# Patient Record
Sex: Female | Born: 1955 | Race: White | Hispanic: No | Marital: Single | Smoking: Never smoker
Health system: Southern US, Community
[De-identification: ages and names within clinical notes are randomized; demographics above are authoritative.]

---

## 2007-08-08 ENCOUNTER — Observation Stay (HOSPITAL_COMMUNITY): Admission: EM | Admit: 2007-08-08 | Discharge: 2007-08-09 | Payer: Self-pay | Admitting: Emergency Medicine

## 2007-09-22 ENCOUNTER — Encounter: Admission: RE | Admit: 2007-09-22 | Discharge: 2007-12-01 | Payer: Self-pay | Admitting: Orthopedic Surgery

## 2008-12-16 ENCOUNTER — Other Ambulatory Visit: Admission: RE | Admit: 2008-12-16 | Discharge: 2008-12-16 | Payer: Self-pay | Admitting: Obstetrics and Gynecology

## 2009-09-18 IMAGING — CR DG FOREARM 2V*L*
2 series · 2 of 2 positions shown · non-contrast
Comparison: None

CLINICAL DATA: Laceration left forearm

LEFT FOREARM - 2 VIEW

[w forearm ap left]
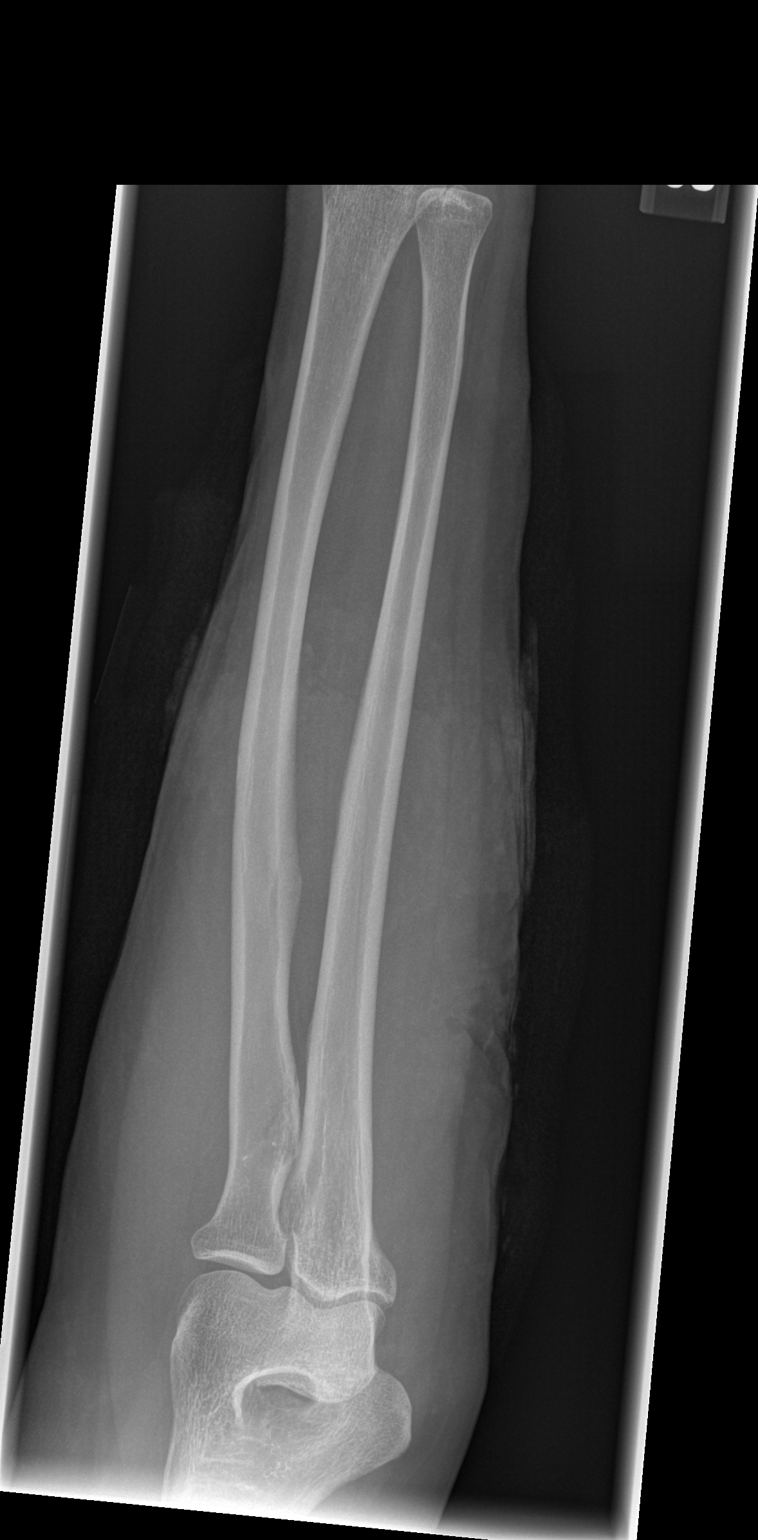

[w forearm lat left]
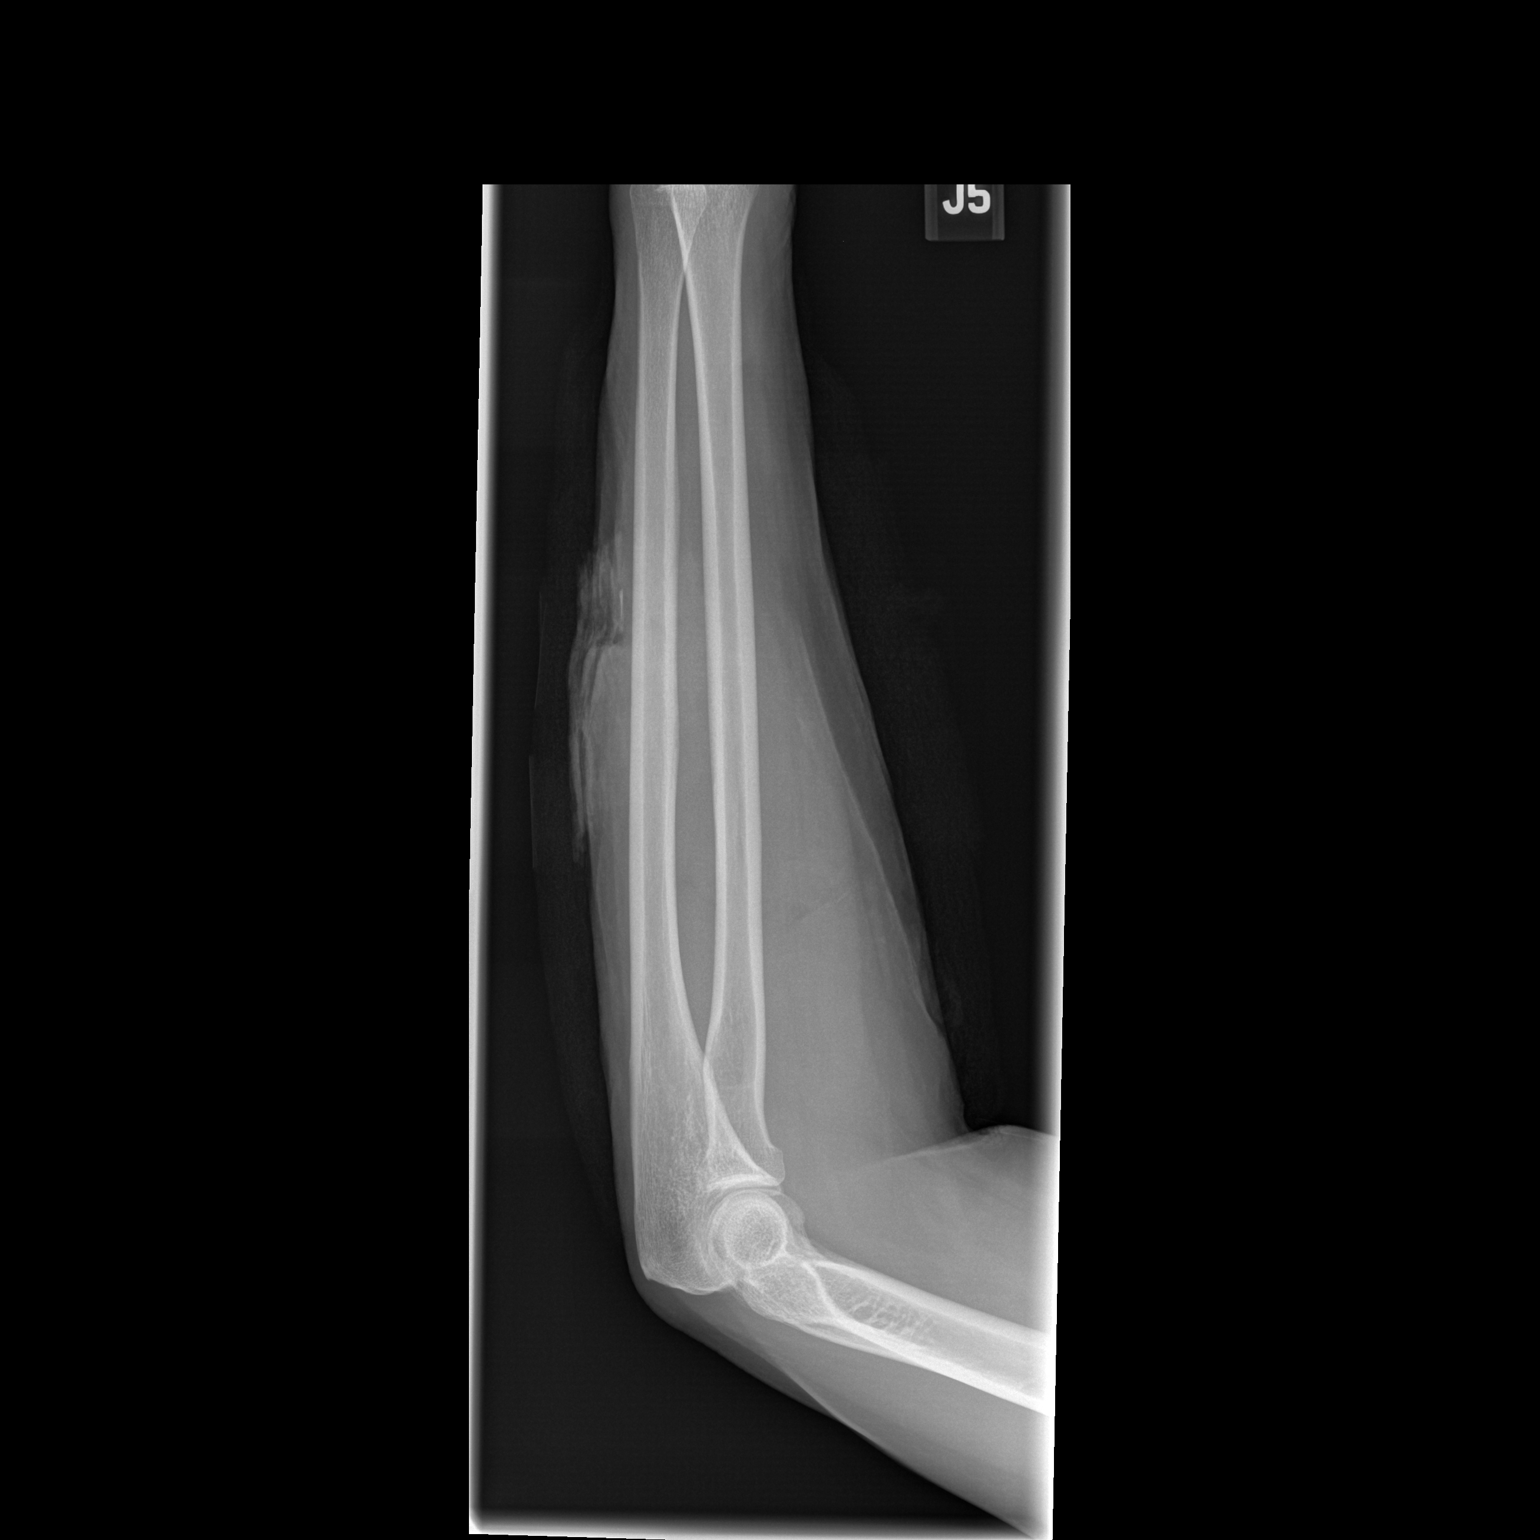

[2 of 2 positions shown; findings below may reference images not displayed]

FINDINGS: Mild osteopenia is noted.  Two views of the left forearm
shows no acute fracture or subluxation.  Bandage artifacts  are
noted.  There is a skin irregularity and probable soft tissue
injury.  Clinical correlation is necessary.
IMPRESSION: No acute fracture or subluxation.  There is  skin irregularity and
probable soft tissue injury.  Clinical correlation is necessary.

## 2010-04-02 ENCOUNTER — Encounter: Payer: Self-pay | Admitting: Obstetrics and Gynecology

## 2010-07-25 NOTE — Op Note (Signed)
NAMEJAMESE, TRAUGER                 ACCOUNT NO.:  [id number]   MEDICAL RECORD NO.:  [id number]          PATIENT TYPE:  INP   LOCATION:  1609                         FACILITY:  [facility]   PHYSICIAN:  Dionne Ano. Gramig III, M.D.DATE OF BIRTH:  [birth year]   DATE OF PROCEDURE:  08/08/2007  DATE OF DISCHARGE:                               OPERATIVE REPORT   PREOPERATIVE DIAGNOSES:  1. Status post plate glass laceration to the left forearm with dorsal      laceration with multiple tendon injuries.  2. Volar laceration x2 involving skin, subcutaneous tissue and muscle.   POSTOPERATIVE DIAGNOSES:  1. Status post plate glass laceration to the left forearm with dorsal      laceration with multiple tendon injuries.  2. Volar laceration x2 involving skin, subcutaneous tissue and muscle.  3. Multiple tendon injuries on the extensor surface including the      extensor digiti minimi, extensor digitorum communis, and extensor      carpi radialis brevis tendon.   SURGICAL PROCEDURES:  1. Irrigation and debridement, dorsal laceration of skin and      subcutaneous tissue muscle via an 8-cm laceration.  2. Irrigation and debridement, volar laceration, skin and subcutaneous      tissue and muscle 8 cm in length.  This was treated with irrigation      and debridement, complex closure, and exploration.  3. Irrigation and debridement, volar laceration 1.5 cm, which was      treated with exploration of the neurovascular structures and      closure.  4. Extensor digitorum communis repair about the ring finger, small      finger, and middle finger, left forearm.  5. Extensor digiti minimi repair, left small finger.  6. Extensor carpi radialis brevis repair, left forearm.  7. Fasciotomy, left forearm.  8. Exploration, volar wound with direct closure of the 8 cm and 1.5 cm      laceration separately.   ASSISTANT:  Karie Chimera, P.A.-C.   COMPLICATIONS:  None.   ANESTHESIA:  General.   TOURNIQUET TIME:   Less than hour.   INDICATIONS FOR PROCEDURE:  This patient is a pleasant female presents  for above-mentioned diagnosis.  I have counseled in regards to risks and  benefits of the surgery including risk of infection, bleeding,  anesthesia, damage to normal structures, and failure of surgery to  accomplish its intended goals of relieving symptoms and restoring  function.  She has some very large lacerations after a fall in the plate  glass while she was painting at her home today.  She understands the  significant predicament, and all questions have been encouraged and  answered.   OPERATIVE PROCEDURE:  The patient was seen by myself and anesthesia, she  was taken to the operative suite and underwent a general anesthetic.  Preoperatively, Ancef was given.  Tetanus was dressed, and the patient  had thorough counseling.  The arm was marked, timeout was called, and  the operation commenced with I&D of the volar lacerations with an 8-cm  laceration.  I&D of the skin, subcutaneous tissue,  and muscle was  accomplished.  This was an excisional debridement.  I explored this  thoroughly.  The ulnar nerve and artery were intact.  She had disruption  of her fascia, but did not have any gross tendon disruption.  This was  closed with complex closure utilizing Prolene at the conclusion of the  case.   Following this, I&D of a volar laceration of 1.5 cm occurred, a thorough  exploration was occurred.  This did not penetrate the fascia but did  require I&D of skin, subcutaneous tissue, and muscle.  There was some  muscle debris from the wound just adjacent to this.  I closed this with  an interrupted Prolene.   Following this I&D, the dorsal laceration occurred.  This was I&D of  skin, subcutaneous tissue, muscle and tendon.  She had multiple tendon  ruptures including the EDC, EDM, and ECRB tendon (extensor carpi  radialis brevis tendon).  I performed an extensive fasciotomy at this  time.   Fasciotomy was performed without difficulty to prevent swelling,  and following this, this was irrigated with greater than 3 liters of  saline as were the other wounds.  Once this was done, I performed repair  with FiberWire suture of the extensor digiti minimi tendon.   Following this, I performed a repair of the extensor digitorum communis,  to the ring, middle, and small finger.  This was at the muscular  tendinous junction.  Hopefully, this will be a decent repair for her.  However, at this high level, results can be variable.   These were repaired with the FiberWire suture, and I gathered these into  a stitch to incorporate all tendons into the main stump.   Following this, I then identified the ECRB tendon.  I performed a tendon  imbrication and repair where laceration occurred.  The patient tolerated  this well.  This was done about the left forearm region of course.   Following this, the wound was closed without difficulty.  It did extend  the incision proximally during the course of the dissection.  Hemostasis  was secured at the end of the procedure.  She had soft compartments.  Good hemostasis.  No complicating features.  With all the wounds closed,  she was then dressed sterilely and placed in a long-arm splint with  fingers and hand wrist in full extension.  She tolerated this well.   We will plan for 4 weeks of complete immobilization followed by  removable splint placement in 4 weeks and interval range of motion.  I  have discussed with her the relevant do's and do not's.  She will be  admitted for postop IV antibiotics, pain management general protocol.  As this was a quite severe laceration, we will do our best to try to  give the best arm possible.   It has been a pleasure to participate in her care.  We look forward to  participate in her postoperative care.      Dionne Ano. Everlene Other, M.D.     Nash Mantis  D:  08/08/2007  T:  08/09/2007  Job:  [id number]

## 2010-12-06 LAB — COMPREHENSIVE METABOLIC PANEL
ALT: 28
AST: 22
Albumin: 4.1
Alkaline Phosphatase: 50
Chloride: 111
Creatinine, Ser: 0.62
Glucose, Bld: 107 — ABNORMAL HIGH
Total Bilirubin: 0.7

## 2010-12-06 LAB — DIFFERENTIAL
Basophils Absolute: 0
Basophils Relative: 0
Eosinophils Relative: 0
Lymphs Abs: 1.2
Monocytes Absolute: 0.8
Neutro Abs: 11.2 — ABNORMAL HIGH

## 2010-12-06 LAB — CBC
Hemoglobin: 14.2
MCHC: 34.2
RDW: 13.8

## 2016-06-12 DIAGNOSIS — J00 Acute nasopharyngitis [common cold]: Secondary | ICD-10-CM | POA: Diagnosis not present

## 2016-07-24 DIAGNOSIS — J3489 Other specified disorders of nose and nasal sinuses: Secondary | ICD-10-CM | POA: Diagnosis not present

## 2016-07-24 DIAGNOSIS — Z23 Encounter for immunization: Secondary | ICD-10-CM | POA: Diagnosis not present

## 2017-12-09 DIAGNOSIS — Z23 Encounter for immunization: Secondary | ICD-10-CM | POA: Diagnosis not present

## 2021-01-05 DIAGNOSIS — S52531A Colles' fracture of right radius, initial encounter for closed fracture: Secondary | ICD-10-CM | POA: Insufficient documentation

## 2021-01-07 DIAGNOSIS — W109XXA Fall (on) (from) unspecified stairs and steps, initial encounter: Secondary | ICD-10-CM | POA: Insufficient documentation

## 2022-06-13 DIAGNOSIS — F331 Major depressive disorder, recurrent, moderate: Secondary | ICD-10-CM | POA: Diagnosis not present

## 2022-08-10 ENCOUNTER — Encounter: Payer: Self-pay | Admitting: Nurse Practitioner

## 2022-08-10 ENCOUNTER — Ambulatory Visit (INDEPENDENT_AMBULATORY_CARE_PROVIDER_SITE_OTHER): Payer: Medicare HMO | Admitting: Nurse Practitioner

## 2022-08-10 VITALS — BP 168/98 | HR 52 | Temp 98.7°F | Ht 69.0 in | Wt 169.0 lb

## 2022-08-10 DIAGNOSIS — Z1211 Encounter for screening for malignant neoplasm of colon: Secondary | ICD-10-CM | POA: Insufficient documentation

## 2022-08-10 DIAGNOSIS — K056 Periodontal disease, unspecified: Secondary | ICD-10-CM

## 2022-08-10 DIAGNOSIS — Z1231 Encounter for screening mammogram for malignant neoplasm of breast: Secondary | ICD-10-CM

## 2022-08-10 DIAGNOSIS — E785 Hyperlipidemia, unspecified: Secondary | ICD-10-CM

## 2022-08-10 DIAGNOSIS — Z8669 Personal history of other diseases of the nervous system and sense organs: Secondary | ICD-10-CM | POA: Diagnosis not present

## 2022-08-10 DIAGNOSIS — F329 Major depressive disorder, single episode, unspecified: Secondary | ICD-10-CM | POA: Diagnosis not present

## 2022-08-10 DIAGNOSIS — Z13820 Encounter for screening for osteoporosis: Secondary | ICD-10-CM | POA: Insufficient documentation

## 2022-08-10 DIAGNOSIS — Z124 Encounter for screening for malignant neoplasm of cervix: Secondary | ICD-10-CM

## 2022-08-10 NOTE — Progress Notes (Signed)
New Patient Office Visit  Subjective    Patient ID: Samantha Cook, female    DOB: 01/10/1956  Age: 67 y.o. MRN: [id number]  CC:  Chief Complaint  Patient presents with   Establish Care    Here to establish. And referral periodontist: Visited 06/29/22 [facility]  [phone] w, [facility]  and [facility]  [address]  FAX: [phone]  Visited on 06/13/2022 Needs dates back dated     HPI Samantha Cook presents to establish care Has not seen primary care provider on a consistent basis for years. She now has new insurance and is now looking to establish care on a consistent basis.  Reports she has history of periodontal disease, would like referral to periodontist. She also has longstanding history of major depression, her mother passed away few years ago and she would like to start counseling services.  Requesting referral to this as well. She is not sure when her last Pap smear was.  She is not sure when her last tetanus shot was.  She has never had colonoscopy for colon cancer screening.  She is due for breast cancer screening via mammogram.  Has not had pneumonia vaccine administered or shingles vaccine administered.  She would also be due for DEXA scan to screen for osteoporosis.  She prefers not to undergo hep C or HIV screening.  She also reports that she was diagnosed with hyperlipidemia in the past, but when treated with statins experience myalgias so she discontinued these.  She reports having been told she has glaucoma, years ago.  Has not followed up with an eye doctor since then.  No outpatient encounter medications on file as of 08/10/2022.   No facility-administered encounter medications on file as of 08/10/2022.    No past medical history on file.    Family History  Problem Relation Age of Onset   Heart attack Father    Hypertension Brother     Social History   Socioeconomic History   Marital  status: Single    Spouse name: Not on file   Number of children: Not on file   Years of education: Not on file   Highest education level: Not on file  Occupational History   Not on file  Tobacco Use   Smoking status: Never   Smokeless tobacco: Never  Substance and Sexual Activity   Alcohol use: Not on file    Comment: rarely   Drug use: Never   Sexual activity: Not Currently  Other Topics Concern   Not on file  Social History Narrative   Not on file   Social Determinants of Health   Financial Resource Strain: Not on file  Food Insecurity: Not on file  Transportation Needs: Not on file  Physical Activity: Not on file  Stress: Not on file  Social Connections: Not on file  Intimate Partner Violence: Not on file    Review of Systems  Respiratory:  Negative for cough, shortness of breath and wheezing.   Cardiovascular:  Negative for chest pain and palpitations.  Gastrointestinal:  Positive for heartburn. Negative for blood in stool.  Genitourinary:  Negative for hematuria.       (-) vaginal bleed  Psychiatric/Behavioral:  Positive for depression (lonely). Negative for suicidal ideas.         Objective    BP (!) 168/98 (BP Location: Left Arm, Patient Position: Sitting, Cuff Size: Normal)   Pulse Marland Kitchen)  52   Temp 98.7 F (37.1 C) (Oral)   Ht 5\' 9"  (1.753 m)   Wt 169 lb (76.7 kg)   SpO2 95%   BMI 24.96 kg/m      08/10/2022    1:12 PM  PHQ9 SCORE ONLY  PHQ-9 Total Score 0     Physical Exam Vitals reviewed.  Constitutional:      General: She is not in acute distress.    Appearance: Normal appearance.  HENT:     Head: Normocephalic and atraumatic.  Neck:     Vascular: No carotid bruit.  Cardiovascular:     Rate and Rhythm: Normal rate and regular rhythm.     Pulses: Normal pulses.     Heart sounds: Normal heart sounds.  Pulmonary:     Effort: Pulmonary effort is normal.     Breath sounds: Normal breath sounds.  Skin:    General: Skin is warm and dry.   Neurological:     General: No focal deficit present.     Mental Status: She is alert and oriented to person, place, and time.  Psychiatric:        Mood and Affect: Mood normal.        Behavior: Behavior normal.        Judgment: Judgment normal.         Assessment & Plan:   Problem List Items Addressed This Visit       Digestive   Periodontal disease - Primary    Referral to periodontist ordered today      Relevant Orders   Ambulatory referral to Dentistry     Other   Major depressive disorder    Referral to counseling service made today Patient denies suicidal ideation Patient will prefer not to consider pharmacological treatment at this time      Relevant Orders   Ambulatory referral to Psychology   CBC   Comprehensive metabolic panel   Lipid panel   TSH   Colon cancer screening    Referral to GI for colon cancer screening made today.      Relevant Orders   Ambulatory referral to Gastroenterology   Cervical cancer screening    Referral to OB/GYN for cervical cancer screening at today.      Relevant Orders   Ambulatory referral to Obstetrics / Gynecology   Hx of glaucoma    Referral to ophthalmology made today.      Relevant Orders   Ambulatory referral to Ophthalmology   Encounter for screening mammogram for malignant neoplasm of breast    Screening mammogram ordered today.      Relevant Orders   MM 3D SCREENING MAMMOGRAM BILATERAL BREAST   Osteoporosis screening    DEXA scan ordered for osteoporosis screening today      Relevant Orders   DG Bone Density   Hyperlipidemia    Labs ordered, further recommendations may be made based upon his results       Relevant Orders   Comprehensive metabolic panel   Lipid panel    Return in about 1 month (around 09/09/2022) for F/U with [name].   [name], NP

## 2022-08-10 NOTE — Assessment & Plan Note (Signed)
Referral to GI for colon cancer screening made today.

## 2022-08-10 NOTE — Assessment & Plan Note (Signed)
Screening mammogram ordered today ?

## 2022-08-10 NOTE — Assessment & Plan Note (Signed)
Referral to periodontist ordered today

## 2022-08-10 NOTE — Patient Instructions (Addendum)
Get tetanus shot at pharmacy if last vaccine was administered >10 years ago Ask about pneumonia vaccine at pharmacy Ask about shingles vaccine at pharmacy

## 2022-08-10 NOTE — Assessment & Plan Note (Signed)
Referral to counseling service made today Patient denies suicidal ideation Patient will prefer not to consider pharmacological treatment at this time

## 2022-08-10 NOTE — Assessment & Plan Note (Signed)
Labs ordered, further recommendations may be made based upon his results. 

## 2022-08-10 NOTE — Assessment & Plan Note (Signed)
Referral to OB/GYN for cervical cancer screening at today.

## 2022-08-10 NOTE — Assessment & Plan Note (Signed)
DEXA scan ordered for osteoporosis screening today 

## 2022-08-10 NOTE — Assessment & Plan Note (Signed)
Referral to ophthalmology made today.  

## 2022-08-15 LAB — COMPREHENSIVE METABOLIC PANEL
ALT: 14 U/L (ref 0–35)
AST: 13 U/L (ref 0–37)
Albumin: 3.6 g/dL (ref 3.5–5.2)
Alkaline Phosphatase: 47 U/L (ref 39–117)
BUN: 17 mg/dL (ref 6–23)
CO2: 28 mEq/L (ref 19–32)
Calcium: 8.5 mg/dL (ref 8.4–10.5)
Chloride: 105 mEq/L (ref 96–112)
Creatinine, Ser: 0.64 mg/dL (ref 0.40–1.20)
GFR: 92.76 mL/min (ref >60.00)
Glucose, Bld: 84 mg/dL (ref 70–99)
Potassium: 4.2 mEq/L (ref 3.5–5.1)
Sodium: 141 mEq/L (ref 135–145)
Total Bilirubin: 0.3 mg/dL (ref 0.2–1.2)
Total Protein: 5.9 g/dL — ABNORMAL LOW (ref 6.0–8.3)

## 2022-08-15 LAB — CBC
HCT: 38.6 % (ref 36.0–46.0)
Hemoglobin: 12.5 g/dL (ref 12.0–15.0)
MCHC: 32.4 g/dL (ref 30.0–36.0)
MCV: 92.9 fl (ref 78.0–100.0)
Platelets: 251 10*3/uL (ref 150.0–400.0)
RBC: 4.16 Mil/uL (ref 3.87–5.11)
RDW: 14.1 % (ref 11.5–15.5)
WBC: 4.4 10*3/uL (ref 4.0–10.5)

## 2022-08-15 LAB — TSH: TSH: 0.96 u[IU]/mL (ref 0.35–5.50)

## 2022-08-15 LAB — LIPID PANEL
Cholesterol: 192 mg/dL (ref 0–200)
HDL: 63.4 mg/dL (ref >39.00)
LDL Cholesterol: 110 mg/dL — ABNORMAL HIGH (ref 0–99)
NonHDL: 128.62
Total CHOL/HDL Ratio: 3
Triglycerides: 95 mg/dL (ref 0.0–149.0)
VLDL: 19 mg/dL (ref 0.0–40.0)

## 2022-08-23 ENCOUNTER — Ambulatory Visit: Payer: Medicare HMO | Admitting: Nurse Practitioner

## 2022-08-29 ENCOUNTER — Ambulatory Visit (INDEPENDENT_AMBULATORY_CARE_PROVIDER_SITE_OTHER): Payer: Medicare HMO | Admitting: Internal Medicine

## 2022-08-29 ENCOUNTER — Encounter: Payer: Self-pay | Admitting: Internal Medicine

## 2022-08-29 VITALS — BP 150/90 | HR 55 | Temp 98.2°F | Ht 69.0 in | Wt 168.0 lb

## 2022-08-29 DIAGNOSIS — I10 Essential (primary) hypertension: Secondary | ICD-10-CM | POA: Diagnosis not present

## 2022-08-29 HISTORY — DX: Essential (primary) hypertension: I10

## 2022-08-29 MED ORDER — VALSARTAN 160 MG PO TABS
160.0000 mg | ORAL_TABLET | Freq: Every day | ORAL | 5 refills | Status: DC
Start: 2022-08-29 — End: 2022-09-14

## 2022-08-29 NOTE — Progress Notes (Signed)
Subjective:  Patient ID: Samantha Cook, female    DOB: 06/05/1955  Age: 67 y.o. MRN: [id number]  CC: Hypertension   HPI Samantha Cook presents for elevated BP SBP>150, headaches. It was 180 a couple times  No outpatient medications prior to visit.   No facility-administered medications prior to visit.    ROS: Review of Systems  Constitutional:  Negative for activity change, appetite change, chills, fatigue and unexpected weight change.  HENT:  Negative for congestion, mouth sores and sinus pressure.   Eyes:  Negative for visual disturbance.  Respiratory:  Negative for cough and chest tightness.   Gastrointestinal:  Negative for abdominal pain and nausea.  Genitourinary:  Negative for difficulty urinating, frequency and vaginal pain.  Musculoskeletal:  Negative for back pain and gait problem.  Skin:  Negative for pallor and rash.  Neurological:  Negative for dizziness, tremors, weakness, numbness and headaches.  Psychiatric/Behavioral:  Negative for confusion and sleep disturbance.     Objective:  BP (!) 150/90 (BP Location: Right Arm, Patient Position: Sitting, Cuff Size: Normal)   Pulse (!) 55   Temp 98.2 F (36.8 C) (Oral)   Ht 5\' 9"  (1.753 m)   Wt 168 lb (76.2 kg)   SpO2 98%   BMI 24.81 kg/m   BP Readings from Last 3 Encounters:  08/29/22 (!) 150/90  08/10/22 (!) 168/98    Wt Readings from Last 3 Encounters:  08/29/22 168 lb (76.2 kg)  08/10/22 169 lb (76.7 kg)    Physical Exam Constitutional:      General: She is not in acute distress.    Appearance: She is well-developed.  HENT:     Head: Normocephalic.     Right Ear: External ear normal.     Left Ear: External ear normal.     Nose: Nose normal.  Eyes:     General:        Right eye: No discharge.        Left eye: No discharge.     Conjunctiva/sclera: Conjunctivae normal.     Pupils: Pupils are equal, round, and reactive to light.  Neck:     Thyroid: No thyromegaly.     Vascular: No JVD.      Trachea: No tracheal deviation.  Cardiovascular:     Rate and Rhythm: Normal rate and regular rhythm.     Heart sounds: Normal heart sounds.  Pulmonary:     Effort: No respiratory distress.     Breath sounds: No stridor. No wheezing.  Abdominal:     General: Bowel sounds are normal. There is no distension.     Palpations: Abdomen is soft. There is no mass.     Tenderness: There is no abdominal tenderness. There is no guarding or rebound.  Musculoskeletal:        General: No tenderness.     Cervical back: Normal range of motion and neck supple. No rigidity.  Lymphadenopathy:     Cervical: No cervical adenopathy.  Skin:    Findings: No erythema or rash.  Neurological:     Cranial Nerves: No cranial nerve deficit.     Motor: No abnormal muscle tone.     Coordination: Coordination normal.     Deep Tendon Reflexes: Reflexes normal.  Psychiatric:        Behavior: Behavior normal.        Thought Content: Thought content normal.        Judgment: Judgment normal.     Lab Results  Component Value Date   WBC 4.4 08/15/2022   HGB 12.5 08/15/2022   HCT 38.6 08/15/2022   PLT 251.0 08/15/2022   GLUCOSE 84 08/15/2022   CHOL 192 08/15/2022   TRIG 95.0 08/15/2022   HDL 63.40 08/15/2022   LDLCALC 110 (H) 08/15/2022   ALT 14 08/15/2022   AST 13 08/15/2022   NA 141 08/15/2022   K 4.2 08/15/2022   CL 105 08/15/2022   CREATININE 0.64 08/15/2022   BUN 17 08/15/2022   CO2 28 08/15/2022   TSH 0.96 08/15/2022    No results found.  Assessment & Plan:   Problem List Items Addressed This Visit     HTN (hypertension) - Primary    New. Start Valsartan 160 mg/d NAS diet RTC 4 wks      Relevant Medications   valsartan (DIOVAN) 160 MG tablet      Meds ordered this encounter  Medications   valsartan (DIOVAN) 160 MG tablet    Sig: Take 1 tablet (160 mg total) by mouth daily.    Dispense:  3 tablet    Refill:  5      Follow-up: Return in about 4 weeks (around 09/26/2022) for a  follow-up visit.  Sonda Primes, MD

## 2022-08-29 NOTE — Assessment & Plan Note (Signed)
New. Start Valsartan 160 mg/d NAS diet RTC 4 wks

## 2022-09-12 ENCOUNTER — Telehealth: Payer: Self-pay | Admitting: Nurse Practitioner

## 2022-09-12 NOTE — Telephone Encounter (Signed)
Patient saw Dr. Posey Rea on 08/29/2022 and he prescribed her valsartan (DIOVAN) 160 MG tablet . She is scheduled for 09/20/2022 to follow up with her PCP, but she does not have enough medication to last until the appointment. She would like to know if more can be sent in to [facility] on [address] in [place]. Best callback is [phone].

## 2022-09-14 ENCOUNTER — Other Ambulatory Visit: Payer: Self-pay

## 2022-09-14 MED ORDER — VALSARTAN 160 MG PO TABS: 160.0000 mg | ORAL_TABLET | Freq: Every day | ORAL | 5 refills | Status: DC

## 2022-09-14 NOTE — Telephone Encounter (Signed)
Medication request send

## 2022-09-20 ENCOUNTER — Ambulatory Visit: Payer: Medicare HMO | Admitting: Nurse Practitioner

## 2022-10-10 ENCOUNTER — Encounter (INDEPENDENT_AMBULATORY_CARE_PROVIDER_SITE_OTHER): Payer: Self-pay

## 2022-10-15 ENCOUNTER — Telehealth: Payer: Self-pay | Admitting: Nurse Practitioner

## 2022-10-15 NOTE — Telephone Encounter (Signed)
Pt  service agent called about referral and insurance change can someone reach out to pt to help better assist pt not really understanding what pt was asking.

## 2022-10-17 NOTE — Telephone Encounter (Signed)
Left voice mail for pt to call back in regards to their issue

## 2022-10-19 ENCOUNTER — Other Ambulatory Visit: Payer: Self-pay | Admitting: Nurse Practitioner

## 2022-10-19 DIAGNOSIS — F329 Major depressive disorder, single episode, unspecified: Secondary | ICD-10-CM

## 2022-10-19 DIAGNOSIS — Z1211 Encounter for screening for malignant neoplasm of colon: Secondary | ICD-10-CM

## 2022-10-19 DIAGNOSIS — K056 Periodontal disease, unspecified: Secondary | ICD-10-CM

## 2022-10-19 DIAGNOSIS — Z8669 Personal history of other diseases of the nervous system and sense organs: Secondary | ICD-10-CM

## 2022-10-19 DIAGNOSIS — Z124 Encounter for screening for malignant neoplasm of cervix: Secondary | ICD-10-CM

## 2022-10-19 NOTE — Telephone Encounter (Signed)
Returned pt called, pt stated that based on her insurance card Dr. Yetta Barre was supposed to her PCP, but someone told her, she could see Lorelee New because Maralyn Sago was under Dr. Yetta Barre she could see her and her insurance will pay for the visit. After her visit with Maralyn Sago, she got a notice from her insurance that they will not pay for it since she did not see Dr. Yetta Barre. Pt want her visit and referral that was put in place during her visit to be resend to her insurance again .

## 2022-10-19 NOTE — Telephone Encounter (Signed)
Patient returned Grace's call and said [facility] would need to resubmit referrals due to insurance issues. The referrals were not covered by insurance at first because insurance had wrong provider listed as PCP. She has gotten that fixed to have [facility] listed as her provider, so they just need to redo those for them to be covered. Best callback is [phone].

## 2022-10-23 NOTE — Telephone Encounter (Signed)
Please call patient

## 2022-11-08 ENCOUNTER — Telehealth: Payer: Self-pay | Admitting: Nurse Practitioner

## 2022-11-08 ENCOUNTER — Encounter: Payer: Self-pay | Admitting: Nurse Practitioner

## 2022-11-08 ENCOUNTER — Ambulatory Visit (INDEPENDENT_AMBULATORY_CARE_PROVIDER_SITE_OTHER): Payer: Medicare HMO | Admitting: Nurse Practitioner

## 2022-11-08 VITALS — BP 136/74 | HR 63 | Temp 97.6°F | Ht 69.0 in | Wt 166.0 lb

## 2022-11-08 DIAGNOSIS — F321 Major depressive disorder, single episode, moderate: Secondary | ICD-10-CM

## 2022-11-08 DIAGNOSIS — I10 Essential (primary) hypertension: Secondary | ICD-10-CM

## 2022-11-08 DIAGNOSIS — E785 Hyperlipidemia, unspecified: Secondary | ICD-10-CM | POA: Diagnosis not present

## 2022-11-08 NOTE — Progress Notes (Signed)
Established Patient Office Visit  Subjective   Patient ID: Samantha Cook, female    DOB: 10-08-1955  Age: 67 y.o. MRN: [id number]  Chief Complaint  Patient presents with   Hyperlipidemia    HLD/HTN/Depression: Last LDL 110.  Patient has tried and failed statins in the past.  Not currently on any medication for cholesterol.  Was prescribed valsartan for hypertension which she tolerated for 2 weeks but then had a few episodes of hypotension so she discontinued it.  Did seek counseling previously for depression after her mother died.  Most likely situational and she reports that her mood is much improved and she is not worried about depressed mood currently.  Is requesting referral back to counseling services to help with insurance coverage.    Review of Systems  Respiratory:  Negative for shortness of breath.   Cardiovascular:  Negative for chest pain.  Psychiatric/Behavioral:  Negative for depression and suicidal ideas. The patient is not nervous/anxious.       Objective:     BP 136/74   Pulse 63   Temp 97.6 F (36.4 C) (Temporal)   Ht 5\' 9"  (1.753 m)   Wt 166 lb (75.3 kg)   SpO2 94%   BMI 24.51 kg/m  BP Readings from Last 3 Encounters:  11/08/22 136/74  08/29/22 (!) 150/90  08/10/22 (!) 168/98   Wt Readings from Last 3 Encounters:  11/08/22 166 lb (75.3 kg)  08/29/22 168 lb (76.2 kg)  08/10/22 169 lb (76.7 kg)         11/08/2022    9:02 AM 08/29/2022   10:18 AM 08/10/2022    1:12 PM  PHQ9 SCORE ONLY  PHQ-9 Total Score 0 0 0     Physical Exam Vitals reviewed.  Constitutional:      General: She is not in acute distress.    Appearance: Normal appearance.  HENT:     Head: Normocephalic and atraumatic.  Neck:     Vascular: No carotid bruit.  Cardiovascular:     Rate and Rhythm: Normal rate and regular rhythm.     Pulses: Normal pulses.     Heart sounds: Normal heart sounds.  Pulmonary:     Effort: Pulmonary effort is normal.     Breath sounds: Normal  breath sounds.  Skin:    General: Skin is warm and dry.  Neurological:     General: No focal deficit present.     Mental Status: She is alert and oriented to person, place, and time.  Psychiatric:        Mood and Affect: Mood normal.        Behavior: Behavior normal.        Judgment: Judgment normal.      No results found for any visits on 11/08/22.    The 10-year ASCVD risk score (Arnett DK, et al., 2019) is: 5.8%    Assessment & Plan:   Problem List Items Addressed This Visit       Cardiovascular and Mediastinum   HTN (hypertension)    Chronic, much improved, stable Continue DASH diet, continue off of valsartan Follow-up in 6 months        Other   Major depressive disorder - Primary    Chronic, stable, in remission Referral to counseling services made today per patient request.  She was encouraged to follow-up with them as she feels that her mood starts to worsen again.      Relevant Orders   Ambulatory referral to  Psychology   Hyperlipidemia    Chronic Encouraged her to continue DASH diet as well as consider adding components of Mediterranean diet to help with maintaining normal cholesterol. We had a shared decision-making conversation regarding medication, for now patient would prefer to focus on lifestyle modification.       Return in about 6 months (around 05/10/2023) for F/U with Maralyn Sago.    [name], NP

## 2022-11-08 NOTE — Assessment & Plan Note (Signed)
Chronic Encouraged her to continue DASH diet as well as consider adding components of Mediterranean diet to help with maintaining normal cholesterol. We had a shared decision-making conversation regarding medication, for now patient would prefer to focus on lifestyle modification.

## 2022-11-08 NOTE — Telephone Encounter (Signed)
I need assistance with a referral for this patient. She was referred to counseling services ([facility]) back in 08/10/2022. She was seen today and tells me that [facility] needs [place] to reorder the referral as they have no record the referral was made. I have called [employer] as requested by the patient and they asked for the CPT code that is attached to the referral. I am unfamiliar with what CPT is needed for this. They suggested I call [employer]. I called [employer], they require an [employer] username in order to verify my identity. I do not have a user name for this company so they were unable to help. I am unsure what to do moving forward to help the patient. Is there anyone that we can forward this message to that may be able to assist the patient in getting our referral submitted to [facility]? I did reorder the referral today (11/08/22) as well to see if this may be helpful in the process.  Thank you.

## 2022-11-08 NOTE — Assessment & Plan Note (Signed)
Chronic, stable, in remission Referral to counseling services made today per patient request.  She was encouraged to follow-up with them as she feels that her mood starts to worsen again.

## 2022-11-08 NOTE — Assessment & Plan Note (Addendum)
Chronic, much improved, stable Continue DASH diet, continue off of valsartan Follow-up in 6 months

## 2022-11-23 ENCOUNTER — Telehealth: Payer: Self-pay | Admitting: Nurse Practitioner

## 2022-11-23 NOTE — Telephone Encounter (Signed)
Due to the pt having medicare [facility] they do not accept medicare referral must be sent to a  another location.

## 2022-11-26 NOTE — Telephone Encounter (Signed)
Called pt and let them be aware to reach out to their insurance about what OB/GYN they are in partner with, then to call us so that we can put in another referral

## 2023-01-23 DIAGNOSIS — H2513 Age-related nuclear cataract, bilateral: Secondary | ICD-10-CM | POA: Diagnosis not present

## 2023-01-23 DIAGNOSIS — H40013 Open angle with borderline findings, low risk, bilateral: Secondary | ICD-10-CM | POA: Diagnosis not present

## 2023-02-28 ENCOUNTER — Telehealth: Payer: Self-pay | Admitting: Internal Medicine

## 2023-02-28 NOTE — Telephone Encounter (Signed)
Not sure why I got this message. She is not a patient at my office.

## 2023-05-10 ENCOUNTER — Ambulatory Visit: Payer: Medicare HMO | Admitting: Nurse Practitioner

## 2023-05-30 ENCOUNTER — Ambulatory Visit: Payer: 59

## 2023-07-02 ENCOUNTER — Ambulatory Visit

## 2023-07-11 ENCOUNTER — Ambulatory Visit

## 2023-08-02 ENCOUNTER — Ambulatory Visit

## 2023-09-09 ENCOUNTER — Ambulatory Visit

## 2023-10-29 ENCOUNTER — Ambulatory Visit: Payer: Self-pay

## 2023-10-29 NOTE — Telephone Encounter (Signed)
 FYI Only or Action Required?: FYI only for provider.  Patient was last seen in primary care on 11/08/2022 by Elnor Lauraine BRAVO, NP.  Called Nurse Triage reporting Chest Pain.  Symptoms began 10 minutes ago.  Interventions attempted: Nothing.  Symptoms are: gradually worsening.  Triage Disposition: Call EMS 911 Now  Patient/caregiver understands and will follow disposition?: Unsure  Copied from CRM #[id number]. Topic: Clinical - Red Word Triage >> Oct 29, 2023  4:22 PM Lauren C wrote: Red Word that prompted transfer to Nurse Triage: chest pain for 5 mins Reason for Disposition  [1] Chest pain lasts > 5 minutes AND [2] age > 4  Answer Assessment - Initial Assessment Questions 1. LOCATION: Where does it hurt?       Size of hand above breast in middle and jaw pain 3. ONSET: When did the chest pain begin? (Minutes, hours or days)      7 min  Pt states that she had an episode more in the side of her chest earlier. Pt states it subsided and now has the substernal CP. Pt states that she also has jaw pain. Pt denies SOB. 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 RN strongly recommended pt go to ED, pt unsure if she will go at time of call.  Protocols used: Chest Pain-A-AH

## 2023-10-29 NOTE — Telephone Encounter (Addendum)
 Patient was triaged by another RN at approximately 1620 today 10/29/23 and refused ED recommendation.  Patient reports that she began experiencing chest pain that radates to her jaw at approximately 1600 today 10/29/23. Reports improvement but not full resolution of her symptoms. States she would like to make an in office appointment.   This RN provided rationale for 911/ED recommendation including both chest pain and jaw pain potentially having a cardiac cause.   Patient refused 911/ED recommendation x 3+ times. This RN contacted PCP office via CAL line to relay refusal. To staff member Lauraine.   This RN returned to patient call and she refused 911/ED once more, citing insurance concerns. Patient agrees to go if symptoms worsen.   Copied from CRM #[id number]. Topic: Clinical - Red Word Triage >> Oct 29, 2023  4:22 PM Tinnie BROCKS wrote: Red Word that prompted transfer to Nurse Triage: chest pain for 5 mins >> Oct 29, 2023  4:38 PM DeAngela L wrote:  patient states she is experiencing chest pain and her jaw is hurting on one side this has beena pain for almost 20 minutes now  Patient thinks its feeling less effects now and patient wants to schedule an OV and not got to ER   Pt num [phone] (M)

## 2023-10-30 NOTE — Telephone Encounter (Signed)
 Called and left voice mail to give our office a call to schedule an appointment in regards to her chest pain and also provided them with our office number

## 2023-11-12 ENCOUNTER — Ambulatory Visit

## 2023-12-13 ENCOUNTER — Ambulatory Visit (INDEPENDENT_AMBULATORY_CARE_PROVIDER_SITE_OTHER)

## 2023-12-13 VITALS — BP 118/80 | HR 68 | Ht 69.0 in | Wt 171.4 lb

## 2023-12-13 DIAGNOSIS — Z0000 Encounter for general adult medical examination without abnormal findings: Secondary | ICD-10-CM

## 2023-12-13 NOTE — Progress Notes (Signed)
 Subjective:   Samantha Cook is a 68 y.o. who presents for a Medicare Wellness preventive visit.  As a reminder, Annual Wellness Visits don't include a physical exam, and some assessments may be limited, especially if this visit is performed virtually. We may recommend an in-person follow-up visit with your provider if needed.  Visit Complete: In person  Persons Participating in Visit: Patient.  AWV Questionnaire: No: Patient Medicare AWV questionnaire was not completed prior to this visit.  Cardiac Risk Factors include: advanced age (>2men, >64 women);hypertension;dyslipidemia     Objective:    Today's Vitals   12/13/23 1544  BP: 118/80  Pulse: 68  SpO2: 98%  Weight: 171 lb 6.4 oz (77.7 kg)  Height: 5' 9 (1.753 m)   Body mass index is 25.31 kg/m.     12/13/2023    3:48 PM  Advanced Directives  Does Patient Have a Medical Advance Directive? Yes  Type of Estate agent of [place];Living will  Copy of Healthcare Power of Attorney in Chart? No - copy requested    Current Medications (verified) No outpatient encounter medications on file as of 12/13/2023.   No facility-administered encounter medications on file as of 12/13/2023.    Allergies (verified) Statins   History: Past Medical History:  Diagnosis Date   HTN (hypertension) 08/29/2022   History reviewed. No pertinent surgical history. Family History  Problem Relation Age of Onset   Heart attack Father    Hypertension Brother    Social History   Socioeconomic History   Marital status: Single    Spouse name: Not on file   Number of children: Not on file   Years of education: Not on file   Highest education level: Not on file  Occupational History   Occupation: RETIRED  Tobacco Use   Smoking status: Never   Smokeless tobacco: Never  Vaping Use   Vaping status: Never Used  Substance and Sexual Activity   Alcohol use: Not on file    Comment: rarely   Drug use: Never   Sexual  activity: Not Currently  Other Topics Concern   Not on file  Social History Narrative   Lives alone/2025   Social Drivers of Health   Financial Resource Strain: Medium Risk (12/13/2023)   Overall Financial Resource Strain (CARDIA)    Difficulty of Paying Living Expenses: Somewhat hard  Food Insecurity: No Food Insecurity (12/13/2023)   Hunger Vital Sign    Worried About Running Out of Food in the Last Year: Never true    Ran Out of Food in the Last Year: Never true  Transportation Needs: No Transportation Needs (12/13/2023)   PRAPARE - Administrator, Civil Service (Medical): No    Lack of Transportation (Non-Medical): No  Physical Activity: Inactive (12/13/2023)   Exercise Vital Sign    Days of Exercise per Week: 0 days    Minutes of Exercise per Session: 0 min  Stress: No Stress Concern Present (12/13/2023)   [employer] of Occupational Health - Occupational Stress Questionnaire    Feeling of Stress: Not at all  Social Connections: Socially Isolated (12/13/2023)   Social Connection and Isolation Panel    Frequency of Communication with Friends and Family: More than three times a week    Frequency of Social Gatherings with Friends and Family: More than three times a week    Attends Religious Services: Never    Database administrator or Organizations: No    Attends Club or  Organization Meetings: Never    Marital Status: Never married    Tobacco Counseling Counseling given: Not Answered    Clinical Intake:  Pre-visit preparation completed: Yes  Pain : No/denies pain     BMI - recorded: 25.31 Nutritional Status: BMI 25 -29 Overweight Nutritional Risks: None Diabetes: No  No results found for: HGBA1C   How often do you need to have someone help you when you read instructions, pamphlets, or other written materials from your doctor or pharmacy?: 1 - Never     Information entered by :: Lorraine Terriquez, RMA   Activities of Daily Living      12/13/2023    3:40 PM  In your present state of health, do you have any difficulty performing the following activities:  Hearing? 0  Vision? 0  Difficulty concentrating or making decisions? 0  Walking or climbing stairs? 0  Dressing or bathing? 0  Doing errands, shopping? 0  Preparing Food and eating ? N  Using the Toilet? N  In the past six months, have you accidently leaked urine? N  Do you have problems with loss of bowel control? N  Managing your Medications? N  Managing your Finances? N  Housekeeping or managing your Housekeeping? N    Patient Care Team: Elnor Lauraine BRAVO, NP as PCP - General (Nurse Practitioner)  I have updated your Care Teams any recent Medical Services you may have received from other providers in the past year.     Assessment:   This is a routine wellness examination for Samantha Cook.  Hearing/Vision screen Hearing Screening - Comments:: Denies hearing difficulties   Vision Screening - Comments:: Denies vision issues/Dr. Octavia    Goals Addressed               This Visit's Progress     Patient Stated (pt-stated)        Lose some weight and build muscles/2025 Current member of [employer]        Depression Screen     12/13/2023    3:50 PM 11/08/2022    9:02 AM 08/29/2022   10:18 AM 08/10/2022    1:12 PM  PHQ 2/9 Scores  PHQ - 2 Score 0 0 0 0  PHQ- 9 Score 0  0     Fall Risk     12/13/2023    3:48 PM 11/08/2022    9:01 AM 08/29/2022   10:18 AM 08/10/2022    1:12 PM  Fall Risk   Falls in the past year? 0 0 0 0  Number falls in past yr: 0 0 0 0  Injury with Fall? 0 0 0 0  Risk for fall due to :  No Fall Risks No Fall Risks No Fall Risks  Follow up Falls evaluation completed;Falls prevention discussed Falls evaluation completed Falls evaluation completed Falls evaluation completed    MEDICARE RISK AT HOME:  Medicare Risk at Home Any stairs in or around the home?: Yes If so, are there any without handrails?: No Home free of loose throw rugs in  walkways, pet beds, electrical cords, etc?: Yes Adequate lighting in your home to reduce risk of falls?: Yes Life alert?: No Use of a cane, walker or w/c?: No Grab bars in the bathroom?: No Shower chair or bench in shower?: Yes Elevated toilet seat or a handicapped toilet?: No  TIMED UP AND GO:  Was the test performed?  Yes  Length of time to ambulate 10 feet: 15 sec Gait steady and fast without  use of assistive device  Cognitive Function: Declined/Normal: No cognitive concerns noted by patient or family. Patient alert, oriented, able to answer questions appropriately and recall recent events. No signs of memory loss or confusion.        Immunizations Immunization History  Administered Date(s) Administered   PPD Test 05/17/2020    Screening Tests Health Maintenance  Topic Date Due   Hepatitis C Screening  Never done   DTaP/Tdap/Td (1 - Tdap) Never done   Mammogram  Never done   Colonoscopy  Never done   Pneumococcal Vaccine: 50+ Years (1 of 1 - PCV) Never done   Zoster Vaccines- Shingrix (1 of 2) Never done   DEXA SCAN  Never done   Influenza Vaccine  10/11/2023   COVID-19 Vaccine (1 - 2024-25 season) Never done   Medicare Annual Wellness (AWV)  12/12/2024   HPV VACCINES  Aged Out   Meningococcal B Vaccine  Aged Out    Health Maintenance Items Addressed: See Nurse Notes at the end of this note  Additional Screening:  Vision Screening: Recommended annual ophthalmology exams for early detection of glaucoma and other disorders of the eye. Is the patient up to date with their annual eye exam?  No  Who is the provider or what is the name of the office in which the patient attends annual eye exams? [facility]  Dental Screening: Recommended annual dental exams for proper oral hygiene  Community Resource Referral / Chronic Care Management: CRR required this visit?  No   CCM required this visit?  No   Plan:    I have personally reviewed and noted the following  in the patient's chart:   Medical and social history Use of alcohol, tobacco or illicit drugs  Current medications and supplements including opioid prescriptions. Patient is not currently taking opioid prescriptions. Functional ability and status Nutritional status Physical activity Advanced directives List of other physicians Hospitalizations, surgeries, and ER visits in previous 12 months Vitals Screenings to include cognitive, depression, and falls Referrals and appointments  In addition, I have reviewed and discussed with patient certain preventive protocols, quality metrics, and best practice recommendations. A written personalized care plan for preventive services as well as general preventive health recommendations were provided to patient.   Cambell Rickenbach L Shamarr Faucett, CMA   12/13/2023   After Visit Summary: (MyChart) Due to this being a telephonic visit, the after visit summary with patients personalized plan was offered to patient via MyChart   Notes: Patient declines all yearly screenings and all vaccines at this time.  She is scheduled to come in for a follow up with Sarah in 2 weeks.

## 2023-12-13 NOTE — Patient Instructions (Signed)
 Samantha Cook,  Thank you for taking the time for your Medicare Wellness Visit. I appreciate your continued commitment to your health goals. Please review the care plan we discussed, and feel free to reach out if I can assist you further.  Medicare recommends these wellness visits once per year to help you and your care team stay ahead of potential health issues. These visits are designed to focus on prevention, allowing your provider to concentrate on managing your acute and chronic conditions during your regular appointments.  Please note that Annual Wellness Visits do not include a physical exam. Some assessments may be limited, especially if the visit was conducted virtually. If needed, we may recommend a separate in-person follow-up with your provider.  Ongoing Care Seeing your primary care provider every 3 to 6 months helps us  monitor your health and provide consistent, personalized care. Last office visit on 11/08/2022.  Next office visit on 12/27/2023.    Referrals If a referral was made during today's visit and you haven't received any updates within two weeks, please contact the referred provider directly to check on the status.  Recommended Screenings:  Health Maintenance  Topic Date Due   Hepatitis C Screening  Never done   DTaP/Tdap/Td vaccine (1 - Tdap) Never done   Breast Cancer Screening  Never done   Colon Cancer Screening  Never done   Pneumococcal Vaccine for age over 16 (1 of 1 - PCV) Never done   Zoster (Shingles) Vaccine (1 of 2) Never done   DEXA scan (bone density measurement)  Never done   Flu Shot  10/11/2023   COVID-19 Vaccine (1 - 2024-25 season) Never done   Medicare Annual Wellness Visit  12/12/2024   HPV Vaccine  Aged Out   Meningitis B Vaccine  Aged Out        No data to display         Advance Care Planning is important because it: Ensures you receive medical care that aligns with your values, goals, and preferences. Provides guidance to your family  and loved ones, reducing the emotional burden of decision-making during critical moments.  Vision: Annual vision screenings are recommended for early detection of glaucoma, cataracts, and diabetic retinopathy. These exams can also reveal signs of chronic conditions such as diabetes and high blood pressure.  Dental: Annual dental screenings help detect early signs of oral cancer, gum disease, and other conditions linked to overall health, including heart disease and diabetes.  Please see the attached documents for additional preventive care recommendations.

## 2023-12-27 ENCOUNTER — Ambulatory Visit: Payer: Self-pay | Admitting: Nurse Practitioner

## 2023-12-27 ENCOUNTER — Ambulatory Visit: Admitting: Nurse Practitioner

## 2023-12-27 VITALS — BP 112/76 | HR 54 | Temp 98.0°F | Ht 69.0 in | Wt 172.1 lb

## 2023-12-27 DIAGNOSIS — E785 Hyperlipidemia, unspecified: Secondary | ICD-10-CM

## 2023-12-27 DIAGNOSIS — H9313 Tinnitus, bilateral: Secondary | ICD-10-CM

## 2023-12-27 DIAGNOSIS — K219 Gastro-esophageal reflux disease without esophagitis: Secondary | ICD-10-CM | POA: Diagnosis not present

## 2023-12-27 DIAGNOSIS — J309 Allergic rhinitis, unspecified: Secondary | ICD-10-CM | POA: Diagnosis not present

## 2023-12-27 DIAGNOSIS — I10 Essential (primary) hypertension: Secondary | ICD-10-CM

## 2023-12-27 DIAGNOSIS — Z6825 Body mass index (BMI) 25.0-25.9, adult: Secondary | ICD-10-CM

## 2023-12-27 DIAGNOSIS — E663 Overweight: Secondary | ICD-10-CM

## 2023-12-27 LAB — TSH: TSH: 1.62 u[IU]/mL (ref 0.35–5.50)

## 2023-12-27 LAB — CBC
HCT: 42 % (ref 36.0–46.0)
Hemoglobin: 13.8 g/dL (ref 12.0–15.0)
MCHC: 33 g/dL (ref 30.0–36.0)
MCV: 92.2 fl (ref 78.0–100.0)
Platelets: 257 K/uL (ref 150.0–400.0)
RBC: 4.55 Mil/uL (ref 3.87–5.11)
RDW: 13.7 % (ref 11.5–15.5)
WBC: 5.4 K/uL (ref 4.0–10.5)

## 2023-12-27 LAB — COMPREHENSIVE METABOLIC PANEL WITH GFR
ALT: 17 U/L (ref 0–35)
AST: 15 U/L (ref 0–37)
Albumin: 4.3 g/dL (ref 3.5–5.2)
Alkaline Phosphatase: 46 U/L (ref 39–117)
BUN: 25 mg/dL — ABNORMAL HIGH (ref 6–23)
CO2: 28 meq/L (ref 19–32)
Calcium: 9.2 mg/dL (ref 8.4–10.5)
Chloride: 106 meq/L (ref 96–112)
Creatinine, Ser: 0.65 mg/dL (ref 0.40–1.20)
GFR: 91.53 mL/min (ref >60.00)
Glucose, Bld: 83 mg/dL (ref 70–99)
Potassium: 4 meq/L (ref 3.5–5.1)
Sodium: 142 meq/L (ref 135–145)
Total Bilirubin: 0.4 mg/dL (ref 0.2–1.2)
Total Protein: 6.7 g/dL (ref 6.0–8.3)

## 2023-12-27 LAB — LIPID PANEL
Cholesterol: 215 mg/dL — ABNORMAL HIGH (ref 0–200)
HDL: 71.9 mg/dL (ref >39.00)
LDL Cholesterol: 134 mg/dL — ABNORMAL HIGH (ref 0–99)
NonHDL: 142.85
Total CHOL/HDL Ratio: 3
Triglycerides: 45 mg/dL (ref 0.0–149.0)
VLDL: 9 mg/dL (ref 0.0–40.0)

## 2023-12-27 LAB — HEMOGLOBIN A1C: Hgb A1c MFr Bld: 5.7 % (ref 4.6–6.5)

## 2023-12-27 MED ORDER — FLUTICASONE PROPIONATE 50 MCG/ACT NA SUSP: 2.0000 | Freq: Every day | NASAL | 6 refills | Status: AC

## 2023-12-27 NOTE — Progress Notes (Signed)
 Established Patient Office Visit  Subjective   Patient ID: Samantha Cook, female    DOB: [birth year]  Age: 68 y.o. MRN: [id number]  Chief Complaint  Patient presents with   Allergic Rhinitis     Discussed the use of AI scribe software for clinical note transcription with the patient, who gave verbal consent to proceed.  History of Present Illness Samantha Cook is a 68 year old female who presents with chronic sinus issues and recent episodes of double vision.  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  Constitutional:      General: She is not in acute distress.    Appearance: Normal appearance.  HENT:     Head: Normocephalic and atraumatic.  Cardiovascular:     Rate and Rhythm: Normal rate and regular rhythm.     Pulses: Normal pulses.     Heart sounds: Normal heart sounds.  Pulmonary:     Effort: Pulmonary effort is normal.     Breath sounds: Normal breath  sounds.  Skin:    General: Skin is warm and dry.  Neurological:     General: No focal deficit present.     Mental Status: She is alert and oriented to person, place, and time.  Psychiatric:        Mood and Affect: Mood normal.        Behavior: Behavior normal.        Judgment: Judgment normal.      No results found for any visits on 12/27/23.    The 10-year ASCVD risk score (Arnett DK, et al., 2019) is: 4.5%    Assessment & Plan:   Problem List Items Addressed This Visit       Cardiovascular and Mediastinum   HTN (hypertension) - Primary   Hypertension Hypertension well-controlled with blood pressure of 112/76 mmHg without medication.      Relevant Orders   CBC   Comprehensive metabolic panel with GFR   Hemoglobin A1c   Lipid panel   TSH     Respiratory   Allergic rhinitis   Chronic sinusitis with congestion and drainage, associated intermittent diplopia and tinnitus Chronic sinusitis with worsening symptoms. Intermittent diplopia concerning. Sinus pressure may contribute. Prefers conservative management. - Refer to ENT for further evaluation. - Recommend daily over-the-counter Zyrtec. - Advise daily Flonase nasal spray, one or two sprays per nostril. - Educated on potential  link between long-term antihistamine use and dementia; recommend short-term use. - Recommend imaging of sinus and brain, patient refuses. Would prefer to see specialist first. Referral ordered.       Relevant Medications   fluticasone (FLONASE) 50 MCG/ACT nasal spray   Other Relevant Orders   CBC   Comprehensive metabolic panel with GFR   Hemoglobin A1c   Lipid panel   TSH     Digestive   GERD (gastroesophageal reflux disease)   Gastroesophageal reflux disease (GERD) GERD symptoms exacerbated by dietary indiscretions. Improved with short-term omeprazole. Discussed risks of long-term omeprazole use versus uncontrolled GERD. - Advise dietary modifications to avoid triggers. -  Recommend omeprazole as needed with caution regarding long-term use.      Relevant Medications   omeprazole (PRILOSEC) 20 MG capsule   Other Relevant Orders   CBC   Comprehensive metabolic panel with GFR   Hemoglobin A1c   Lipid panel   TSH     Other   Hyperlipidemia   Hyperlipidemia Hyperlipidemia with LDL previously slightly above target. Recent dietary changes may impact cholesterol. - Order fasting lipid panel. - Advise dietary modifications to reduce red meat and increase lean proteins.      Relevant Orders   CBC   Comprehensive metabolic panel with GFR   Hemoglobin A1c   Lipid panel   TSH   BMI 25.0-25.9,adult   Labs ordered, further recommendations may be made based upon his results       Relevant Orders   CBC   Comprehensive metabolic panel with GFR   Hemoglobin A1c   Lipid panel   TSH   Overweight   Labs ordered, further recommendations may be made based upon his results       Relevant Orders   CBC   Comprehensive metabolic panel with GFR   Hemoglobin A1c   Lipid panel   TSH   Tinnitus of both ears   Referral to ENT, imaging recommended but patient refused.       Relevant Orders   Ambulatory referral to ENT   CBC   Comprehensive metabolic panel with GFR   Hemoglobin A1c   Lipid panel   TSH   Assessment and Plan Assessment & Plan Chronic sinusitis with congestion and drainage, associated intermittent diplopia and tinnitus Chronic sinusitis with worsening symptoms. Intermittent diplopia concerning. Sinus pressure may contribute. Prefers conservative management. - Refer to ENT for further evaluation. - Recommend daily over-the-counter Zyrtec. - Advise daily Flonase nasal spray, one or two sprays per nostril. - Educated on potential link between long-term antihistamine use and dementia; recommend short-term use. - Recommend imaging of sinus and brain, patient refuses. Would prefer to see specialist first. Referral ordered.    Hyperlipidemia Hyperlipidemia with LDL previously slightly above target. Recent dietary changes may impact cholesterol. - Order fasting lipid panel. - Advise dietary modifications to reduce red meat and increase lean proteins - Does not tolerate statins  Hypertension Hypertension well-controlled with blood pressure of 112/76 mmHg without medication.  Gastroesophageal reflux disease (GERD) GERD symptoms exacerbated by dietary indiscretions. Improved with short-term omeprazole. Discussed risks of long-term omeprazole use versus uncontrolled GERD. - Advise dietary modifications to avoid triggers. - Recommend omeprazole as needed with caution regarding long-term use.   Return in about 6 months (around 06/26/2024) for F/U with Jakyah Bradby.    Lauraine FORBES Pereyra, NP

## 2023-12-27 NOTE — Assessment & Plan Note (Signed)
 Labs ordered, further recommendations may be made based upon his results.

## 2023-12-27 NOTE — Assessment & Plan Note (Signed)
 Chronic sinusitis with congestion and drainage, associated intermittent diplopia and tinnitus Chronic sinusitis with worsening symptoms. Intermittent diplopia concerning. Sinus pressure may contribute. Prefers conservative management. - Refer to ENT for further evaluation. - Recommend daily over-the-counter Zyrtec. - Advise daily Flonase nasal spray, one or two sprays per nostril. - Educated on potential link between long-term antihistamine use and dementia; recommend short-term use. - Recommend imaging of sinus and brain, patient refuses. Would prefer to see specialist first. Referral ordered.

## 2023-12-27 NOTE — Assessment & Plan Note (Signed)
 Hypertension Hypertension well-controlled with blood pressure of 112/76 mmHg without medication.

## 2023-12-27 NOTE — Assessment & Plan Note (Signed)
 Gastroesophageal reflux disease (GERD) GERD symptoms exacerbated by dietary indiscretions. Improved with short-term omeprazole. Discussed risks of long-term omeprazole use versus uncontrolled GERD. - Advise dietary modifications to avoid triggers. - Recommend omeprazole as needed with caution regarding long-term use.

## 2023-12-27 NOTE — Assessment & Plan Note (Signed)
 Hyperlipidemia Hyperlipidemia with LDL previously slightly above target. Recent dietary changes may impact cholesterol. - Order fasting lipid panel. - Advise dietary modifications to reduce red meat and increase lean proteins.

## 2023-12-27 NOTE — Assessment & Plan Note (Signed)
 Referral to ENT, imaging recommended but patient refused.

## 2024-01-28 ENCOUNTER — Institutional Professional Consult (permissible substitution) (INDEPENDENT_AMBULATORY_CARE_PROVIDER_SITE_OTHER): Admitting: Physician Assistant
